# Patient Record
Sex: Male | Born: 1995 | Race: White | Hispanic: No | Marital: Single | Smoking: Never smoker
Health system: Southern US, Community
[De-identification: ages and names within clinical notes are randomized; demographics above are authoritative.]

## PROBLEM LIST (undated history)

## (undated) DIAGNOSIS — R569 Unspecified convulsions: Secondary | ICD-10-CM

## (undated) HISTORY — PX: CIRCUMCISION: SHX1350

## (undated) HISTORY — DX: Unspecified convulsions: R56.9

---
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
## 2017-02-23 ENCOUNTER — Other Ambulatory Visit: Payer: Self-pay

## 2017-02-23 MED ORDER — CITALOPRAM HYDROBROMIDE 20 MG PO TABS: 40.0000 mg | ORAL_TABLET | Freq: Every day | ORAL | 3 refills | Status: DC

## 2017-03-28 ENCOUNTER — Ambulatory Visit: Admitting: Family Medicine

## 2017-09-09 ENCOUNTER — Ambulatory Visit (INDEPENDENT_AMBULATORY_CARE_PROVIDER_SITE_OTHER): Admitting: Family Medicine

## 2017-09-09 ENCOUNTER — Encounter: Payer: Self-pay | Admitting: Family Medicine

## 2017-09-09 VITALS — BP 116/68 | HR 87 | Temp 97.9°F | Ht 69.0 in | Wt 153.0 lb

## 2017-09-09 DIAGNOSIS — S91112A Laceration without foreign body of left great toe without damage to nail, initial encounter: Secondary | ICD-10-CM | POA: Diagnosis not present

## 2017-09-09 DIAGNOSIS — F321 Major depressive disorder, single episode, moderate: Secondary | ICD-10-CM | POA: Diagnosis not present

## 2017-09-09 DIAGNOSIS — F419 Anxiety disorder, unspecified: Secondary | ICD-10-CM | POA: Diagnosis not present

## 2017-09-09 MED ORDER — SERTRALINE HCL 100 MG PO TABS: 100.0000 mg | ORAL_TABLET | Freq: Every day | ORAL | 3 refills | Status: DC

## 2017-09-09 NOTE — Patient Instructions (Signed)
It was very nice to see you today.  Your toe looks like it is healing normally.  We do not need to do anything else at this point.  Please keep a close eye on it and let me know if you see any redness or swelling or drainage.  We will switch your Celexa to Zoloft today.  Please take 1 pill daily.  Come back to see me in 4 to 6 weeks.  I think would also be a good idea for you to see our behavioral specialist.  Please let me know if you like to have this appointment set up.  Take care, Dr Jimmey Ralph

## 2017-09-09 NOTE — Progress Notes (Signed)
Subjective:  Jacob Huffman is a 22 y.o. male who presents today with a chief complaint of toe laceration.   HPI:  Toe Laceration, New problem Patient unfortunately suffered a laceration to his left great toe 4 days ago while mowing his yard.  Patient states that he was going downhill when he lost his balance and slipped and his foot went under the lawnmower.  He went to urgent care for evaluation.  Reportedly had x-rays done which were negative.  His laceration was treated by tissue adhesive and he was started on Keflex.  His wound has been stable over the last couple of days.  No pain.  No drainage.  No redness.  Depression/Anxiety, established problems, Stable Patient initially seen for this 9 months ago.  At that time he was started on Celexa.  He did well on this for about 6 or 7 months, however has noticed worsened symptoms over the last couple of months.  He is not sure if this is due to the medication wearing off or if it is due to increased stress at school.  Patient also notes that during his most recent semester he had several projects that he is not particularly interested in.  He ended up doing well this semester.  He hopes the next semester will be different.  Patient also states that the Celexa never made him feel fully better.  He is possibly interested in switching medications today.  Depression screen PHQ 2/9 09/09/2017  Decreased Interest 3  Down, Depressed, Hopeless 3  PHQ - 2 Score 6  Altered sleeping 2  Tired, decreased energy 3  Change in appetite 1  Feeling bad or failure about yourself  2  Trouble concentrating 1  Moving slowly or fidgety/restless 0  Suicidal thoughts 0  PHQ-9 Score 15  Difficult doing work/chores Very difficult    GAD 7 : Generalized Anxiety Score 09/09/2017  Nervous, Anxious, on Edge 3  Control/stop worrying 3  Worry too much - different things 3  Trouble relaxing 2  Restless 0  Easily annoyed or irritable 0  Afraid - awful might happen  1  Total GAD 7 Score 12  Anxiety Difficulty -   ROS: Per HPI  PMH: He reports that he has never smoked. He has never used smokeless tobacco. He reports that he does not drink alcohol or use drugs.   Objective:  Physical Exam: BP 116/68 (BP Location: Left Arm, Patient Position: Sitting, Cuff Size: Normal)   Pulse 87   Temp 97.9 F (36.6 C) (Oral)   Ht 5\' 9"  (1.753 m)   Wt 153 lb (69.4 kg)   SpO2 97%   BMI 22.59 kg/m   Gen: NAD, resting comfortably MSK: -Left foot: Approximately 2 cm laceration to distal aspect of left great toe.  No surrounding erythema or drainage.  Tissue adhesive in place. Psych: Normal affect.  Normal thought content.  Assessment/Plan:  Anxiety GAD elevated.  Will switch from Celexa to Zoloft as he never had complete remission with Celexa.  Start Zoloft at 100 mg daily.  He will follow-up with me in 4 to 6 weeks.  Increased dose of Zoloft as tolerated.  Discussed possible side effects of medications and reasons to return to care earlier.  Depression, major, single episode, moderate (HCC) See anxiety A/P.  PHQ elevated today switch from Celexa to Zoloft.  Discussed referral to behavioral specialist.  Patient said he will look into this.  Follow-up in 4 to 6 weeks.  Toe laceration Appears  to be healing normally.  He is up-to-date on tetanus vaccines.Do not need further intervention at this point.  Discussed reasons to return to care.  Katina Degree. Jimmey Ralph, MD 09/09/2017 2:43 PM

## 2017-09-09 NOTE — Assessment & Plan Note (Signed)
GAD elevated.  Will switch from Celexa to Zoloft as he never had complete remission with Celexa.  Start Zoloft at 100 mg daily.  He will follow-up with me in 4 to 6 weeks.  Increased dose of Zoloft as tolerated.  Discussed possible side effects of medications and reasons to return to care earlier.

## 2017-09-09 NOTE — Assessment & Plan Note (Signed)
See anxiety A/P.  PHQ elevated today switch from Celexa to Zoloft.  Discussed referral to behavioral specialist.  Patient said he will look into this.  Follow-up in 4 to 6 weeks.

## 2017-09-22 ENCOUNTER — Other Ambulatory Visit: Payer: Self-pay

## 2017-09-22 ENCOUNTER — Telehealth: Payer: Self-pay | Admitting: Family Medicine

## 2017-09-22 MED ORDER — SERTRALINE HCL 100 MG PO TABS: 100.0000 mg | ORAL_TABLET | Freq: Every day | ORAL | 0 refills | Status: DC

## 2017-09-22 NOTE — Telephone Encounter (Signed)
Rx sent to pharmacy   

## 2017-09-22 NOTE — Telephone Encounter (Signed)
Copied from CRM [phone]. Topic: Quick Communication - Rx Refill/Question >> Sep 22, 2017 10:29 AM Oneal GroutSebastian, Jennifer S wrote: Medication: sertraline (ZOLOFT) 100 MG tablet 90 day supply  Has the patient contacted their pharmacy? Yes.  Need rx request (Agent: If no, request that the patient contact the pharmacy for the refill.) (Agent: If yes, when and what did the pharmacy advise?)  [facility]  Agent: Please be advised that RX refills may take up to 3 business days. We ask that you follow-up with your pharmacy.

## 2017-09-22 NOTE — Telephone Encounter (Signed)
LOV  09/09/17 Dr. Jimmey RalphParker Last refill 09/09/17   Pt. Is [facility]

## 2017-09-22 NOTE — Telephone Encounter (Signed)
See request °

## 2018-03-27 ENCOUNTER — Ambulatory Visit: Admitting: Family Medicine

## 2018-03-28 ENCOUNTER — Ambulatory Visit: Admitting: Family Medicine

## 2018-03-28 ENCOUNTER — Ambulatory Visit (INDEPENDENT_AMBULATORY_CARE_PROVIDER_SITE_OTHER): Admitting: Family Medicine

## 2018-03-28 ENCOUNTER — Encounter: Payer: Self-pay | Admitting: Family Medicine

## 2018-03-28 VITALS — BP 104/62 | HR 70 | Temp 98.2°F | Ht 69.0 in | Wt 163.4 lb

## 2018-03-28 DIAGNOSIS — F321 Major depressive disorder, single episode, moderate: Secondary | ICD-10-CM

## 2018-03-28 DIAGNOSIS — F419 Anxiety disorder, unspecified: Secondary | ICD-10-CM | POA: Diagnosis not present

## 2018-03-28 MED ORDER — SERTRALINE HCL 50 MG PO TABS: 50.0000 mg | ORAL_TABLET | Freq: Two times a day (BID) | ORAL | 1 refills | Status: DC

## 2018-03-28 NOTE — Assessment & Plan Note (Signed)
PHQ elevated 17 today.  Discussed treatment options with patient including increasing dose of Zoloft, switching to an alternative SSRI, or augmenting with an additional agent.  He wishes to try twice daily dosing at this time.  Given that his symptoms have improved I think this is reasonable.  We will switch to 50 mg twice daily.  Would ideally like to increase to total 200 mg daily as tolerated before switching to any other agent or augmenting with any other medication.  He will follow-up with me via my chart in a few weeks.

## 2018-03-28 NOTE — Assessment & Plan Note (Signed)
See depression A/P.  Switch Zoloft from 100 mg daily to 50 mg twice daily.

## 2018-03-28 NOTE — Progress Notes (Signed)
   Subjective:  Jacob Huffman is a 22 y.o. male who presents today with a chief complaint of depression and anxiety follow-up.   HPI:  Depression/Anxiety, established problems, Stable Patient was last seen about 7 months ago for this.  At that visit, we switched him from Celexa to Zoloft.  He is currently on 100 mg daily.  Medication helps with his symptoms some however still has feelings of being down, depressed, hopeless, and anxious feelings.  He feels like the medications help him with his problem solving ability however.  Occasionally feels very groggy during the day which he attributes to taking the medication in the morning.  Depression screen PHQ 2/9 03/28/2018  Decreased Interest 2  Down, Depressed, Hopeless 3  PHQ - 2 Score 5  Altered sleeping 3  Tired, decreased energy 3  Change in appetite 1  Feeling bad or failure about yourself  3  Trouble concentrating 2  Moving slowly or fidgety/restless 0  Suicidal thoughts 0  PHQ-9 Score 17  Difficult doing work/chores Somewhat difficult   GAD 7 : Generalized Anxiety Score 03/28/2018  Nervous, Anxious, on Edge 2  Control/stop worrying 3  Worry too much - different things 3  Trouble relaxing 1  Restless 0  Easily annoyed or irritable 1  Afraid - awful might happen 1  Total GAD 7 Score 11  Anxiety Difficulty Somewhat difficult   ROS: Per HPI  Objective:  Physical Exam: BP 104/62 (BP Location: Left Arm, Patient Position: Sitting, Cuff Size: Normal)   Pulse 70   Temp 98.2 F (36.8 C) (Oral)   Ht 5\' 9"  (1.753 m)   Wt 163 lb 6.1 oz (74.1 kg)   SpO2 98%   BMI 24.13 kg/m   Gen: NAD, resting comfortably Neuro: Grossly normal, moves all extremities Psych: Normal affect and thought content  Assessment/Plan:  Depression, major, single episode, moderate (HCC) PHQ elevated 17 today.  Discussed treatment options with patient including increasing dose of Zoloft, switching to an alternative SSRI, or augmenting with an  additional agent.  He wishes to try twice daily dosing at this time.  Given that his symptoms have improved I think this is reasonable.  We will switch to 50 mg twice daily.  Would ideally like to increase to total 200 mg daily as tolerated before switching to any other agent or augmenting with any other medication.  He will follow-up with me via my chart in a few weeks.  Anxiety See depression A/P.  Switch Zoloft from 100 mg daily to 50 mg twice daily.  Jacob Degreealeb M. Jimmey RalphParker, MD 03/28/2018 12:27 PM

## 2018-03-28 NOTE — Patient Instructions (Signed)
It was very nice to see you today!  Please take the zoloft 50mg  twice daily.  Please send me a message in 2 to 3 months via my chart.  Let me know sooner if the medication is not working for you.   Take care, Dr Jimmey RalphParker

## 2018-04-07 ENCOUNTER — Other Ambulatory Visit: Payer: Self-pay | Admitting: Family Medicine

## 2018-04-07 MED ORDER — SERTRALINE HCL 50 MG PO TABS: 50.0000 mg | ORAL_TABLET | Freq: Two times a day (BID) | ORAL | 1 refills | Status: DC

## 2018-04-07 NOTE — Telephone Encounter (Signed)
Copied from CRM [phone]#[id number]. Topic: Quick Communication - Rx Refill/Question >> Apr 07, 2018 11:19 AM Jilda Rocheemaray, Melissa wrote: Medication: sertraline (ZOLOFT) 50 MG tablet  Has the patient contacted their pharmacy? Yes.   (Agent: If no, request that the patient contact the pharmacy for the refill.) (Agent: If yes, when and what did the pharmacy advise?) Contact office  Preferred Pharmacy (with phone number or street name): [facility] [place] [address] [place] [id number] Phone: [phone] Fax: [phone] Not a 24 hour pharmacy; exact hours not known.    Agent: Please be advised that RX refills may take up to 3 business days. We ask that you follow-up with your pharmacy.

## 2018-04-18 ENCOUNTER — Ambulatory Visit: Payer: Self-pay | Admitting: *Deleted

## 2018-04-18 NOTE — Telephone Encounter (Signed)
Patients mother, Darl Pikes, requesting a call back to discuss meningitis and HPV vaccination schedules- and discuss whether they are needed.  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 [place] was not able to find information re: time frame in between series of vaccines. Pt's mother is on [place].  Please advise: [phone]  Reason for Disposition . [1] Caller requesting NON-URGENT health information AND [2] PCP's office is the best resource  Answer Assessment - Initial Assessment Questions 1. REASON FOR CALL or QUESTION: "What is your reason for calling today?" or "How can I best help you?" or "What question do you have that I can help answer?"     Information on vaccines  Protocols used: INFORMATION ONLY CALL-A-AH

## 2018-04-18 NOTE — Telephone Encounter (Signed)
See note

## 2018-04-20 ENCOUNTER — Encounter: Payer: Self-pay | Admitting: Family Medicine

## 2018-04-20 ENCOUNTER — Ambulatory Visit (INDEPENDENT_AMBULATORY_CARE_PROVIDER_SITE_OTHER)

## 2018-04-20 DIAGNOSIS — Z23 Encounter for immunization: Secondary | ICD-10-CM | POA: Diagnosis not present

## 2018-04-20 NOTE — Telephone Encounter (Signed)
Spoke with patient mother regarding Vaccines.Voices understanding.

## 2019-03-26 ENCOUNTER — Other Ambulatory Visit: Payer: Self-pay

## 2019-03-26 ENCOUNTER — Encounter: Payer: Self-pay | Admitting: Family Medicine

## 2019-03-26 ENCOUNTER — Ambulatory Visit (INDEPENDENT_AMBULATORY_CARE_PROVIDER_SITE_OTHER): Admitting: Family Medicine

## 2019-03-26 VITALS — BP 110/68 | HR 68 | Temp 98.2°F | Ht 69.0 in | Wt 166.0 lb

## 2019-03-26 DIAGNOSIS — Z0001 Encounter for general adult medical examination with abnormal findings: Secondary | ICD-10-CM | POA: Diagnosis not present

## 2019-03-26 DIAGNOSIS — F321 Major depressive disorder, single episode, moderate: Secondary | ICD-10-CM

## 2019-03-26 DIAGNOSIS — Z23 Encounter for immunization: Secondary | ICD-10-CM | POA: Diagnosis not present

## 2019-03-26 DIAGNOSIS — F419 Anxiety disorder, unspecified: Secondary | ICD-10-CM | POA: Diagnosis not present

## 2019-03-26 NOTE — Progress Notes (Signed)
Chief Complaint:  Jacob Huffman is a 23 y.o. male who presents today for his annual comprehensive physical exam.    Assessment/Plan:  Depression, major, single episode, moderate (HCC) Resolved.  Now doing well off medications.  Continue with watchful waiting.  Anxiety Resolved.  Doing well off medications.  We will continue with watchful waiting.  Preventative Healthcare: Flu vaccine today.   Patient Counseling(The following topics were reviewed and/or handout was given):  -Nutrition: Stressed importance of moderation in sodium/caffeine intake, saturated fat and cholesterol, caloric balance, sufficient intake of fresh fruits, vegetables, and fiber.  -Stressed the importance of regular exercise.   -Substance Abuse: Discussed cessation/primary prevention of tobacco, alcohol, or other drug use; driving or other dangerous activities under the influence; availability of treatment for abuse.   -Injury prevention: Discussed safety belts, safety helmets, smoke detector, smoking near bedding or upholstery.   -Sexuality: Discussed sexually transmitted diseases, partner selection, use of condoms, avoidance of unintended pregnancy and contraceptive alternatives.   -Dental health: Discussed importance of regular tooth brushing, flossing, and dental visits.  -Health maintenance and immunizations reviewed. Please refer to Health maintenance section.  Return to care in 1 year for next preventative visit.     Subjective:  HPI:  He has no acute complaints today.   Lifestyle Diet: Balanced.  Exercise: No specific exercises.   Depression screen PHQ 2/9 03/26/2019  Decreased Interest 1  Down, Depressed, Hopeless 1  PHQ - 2 Score 2  Altered sleeping 1  Tired, decreased energy 1  Change in appetite 1  Feeling bad or failure about yourself  1  Trouble concentrating 2  Moving slowly or fidgety/restless 0  Suicidal thoughts 0  PHQ-9 Score 8  Difficult doing work/chores Somewhat difficult     Health Maintenance Due  Topic Date Due  . INFLUENZA VACCINE  11/11/2018     ROS: Per HPI, otherwise a complete review of systems was negative.   PMH:  The following were reviewed and entered/updated in epic: Past Medical History:  Diagnosis Date  . Seizures [facility])    Patient Active Problem List   Diagnosis Date Noted  . Depression, major, single episode, moderate (HCC) 09/09/2017  . Anxiety 12/08/2016  . 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 History of seizures 03/29/2016   Past Surgical History:  Procedure Laterality Date  . CIRCUMCISION      Family History  Problem Relation Age of Onset  . Hypothyroidism Mother   . Cancer Maternal Grandmother   . Heart attack Maternal Grandmother   . Heart attack Maternal Grandfather   . Cancer Paternal Grandfather     Medications- reviewed and updated No current outpatient medications on file.   No current facility-administered medications for this visit.    Allergies-reviewed and updated Allergies  Allergen Reactions  . Penicillins Other (See Comments)    **Mother is allergic**    Social History   Socioeconomic History  . Marital status: Single    Spouse name: Not on file  . Number of children: Not on file  . Years of education: Not on file  . Highest education level: Not on file  Occupational History  . Occupation: Consulting civil engineer  Tobacco Use  . Smoking status: Never Smoker  . Smokeless tobacco: Never Used  Substance and Sexual Activity  . Alcohol use: No  . Drug use: No  . Sexual activity: Never  Other Topics Concern  . Not on file  Social History Narrative   Jacob Huffman is  a high school graduate.   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   He attends Secretary/administrator for [employer] in [place].   He lives on campus during school and at home when he's off.   Social Determinants of Health   Financial Resource Strain:   . Difficulty of Paying Living Expenses: Not on file   Food Insecurity:   . Worried About Charity fundraiser in the Last Year: Not on file  . Ran Out of Food in the Last Year: Not on file  Transportation Needs:   . Lack of Transportation (Medical): Not on file  . Lack of Transportation (Non-Medical): Not on file  Physical Activity:   . Days of Exercise per Week: Not on file  . Minutes of Exercise per Session: Not on file  Stress:   . Feeling of Stress : Not on file  Social Connections:   . Frequency of Communication with Friends and Family: Not on file  . Frequency of Social Gatherings with Friends and Family: Not on file  . Attends Religious Services: Not on file  . Active Member of Clubs or Organizations: Not on file  . Attends Archivist Meetings: Not on file  . Marital Status: Not on file        Objective:  Physical Exam: BP 110/68   Pulse 68   Temp 98.2 F (36.8 C)   Ht 5\' 9"  (1.753 m)   Wt 166 lb (75.3 kg)   SpO2 99%   BMI 24.51 kg/m   Body mass index is 24.51 kg/m. Wt Readings from Last 3 Encounters:  03/26/19 166 lb (75.3 kg)  03/28/18 163 lb 6.1 oz (74.1 kg)  09/09/17 153 lb (69.4 kg) (46 %, Z= -0.11)*   * Growth percentiles are based on CDC (Boys, 2-20 Years) data.   Gen: NAD, resting comfortably HEENT: TMs normal bilaterally. OP clear. No thyromegaly noted.  CV: RRR with no murmurs appreciated Pulm: NWOB, CTAB with no crackles, wheezes, or rhonchi GI: Normal bowel sounds present. Soft, Nontender, Nondistended. MSK: no edema, cyanosis, or clubbing noted Skin: warm, dry Neuro: CN2-12 grossly intact. Strength 5/5 in upper and lower extremities. Reflexes symmetric and intact bilaterally.  Psych: Normal affect and thought content     Caleb M. Jerline Pain, MD 03/26/2019 3:32 PM

## 2019-03-26 NOTE — Patient Instructions (Signed)
It was very nice to see you today!  Keep up the good work!  We will give your flu vaccine today.  Come back in 1 year for your next checkup, or sooner if needed.  Take care, Dr Jerline Pain  Please try these tips to maintain a healthy lifestyle:   Eat at least 3 REAL meals and 1-2 snacks per day.  Aim for no more than 5 hours between eating.  If you eat breakfast, please do so within one hour of getting up.    Each meal should contain half fruits/vegetables, one quarter protein, and one quarter carbs (no bigger than a computer mouse)   Cut down on sweet beverages. This includes juice, soda, and sweet tea.     Drink at least 1 glass of water with each meal and aim for at least 8 glasses per day   Exercise at least 150 minutes every week.   Preventive Care 48-43 Years Old, Male Preventive care refers to lifestyle choices and visits with your health care provider that can promote health and wellness. This includes:  A yearly physical exam. This is also called an annual well check.  Regular dental and eye exams.  Immunizations.  Screening for certain conditions.  Healthy lifestyle choices, such as eating a healthy diet, getting regular exercise, not using drugs or products that contain nicotine and tobacco, and limiting alcohol use. What can I expect for my preventive care visit? Physical exam Your health care provider will check:  Height and weight. These may be used to calculate body mass index (BMI), which is a measurement that tells if you are at a healthy weight.  Heart rate and blood pressure.  Your skin for abnormal spots. Counseling Your health care provider may ask you questions about:  Alcohol, tobacco, and drug use.  Emotional well-being.  Home and relationship well-being.  Sexual activity.  Eating habits.  Work and work Statistician. What immunizations do I need?  Influenza (flu) vaccine  This is recommended every year. Tetanus, diphtheria, and  pertussis (Tdap) vaccine  You may need a Td booster every 10 years. Varicella (chickenpox) vaccine  You may need this vaccine if you have not already been vaccinated. Human papillomavirus (HPV) vaccine  If recommended by your health care provider, you may need three doses over 6 months. Measles, mumps, and rubella (MMR) vaccine  You may need at least one dose of MMR. You may also need a second dose. Meningococcal conjugate (MenACWY) vaccine  One dose is recommended if you are 58-9 years old and a Market researcher living in a residence hall, or if you have one of several medical conditions. You may also need additional booster doses. Pneumococcal conjugate (PCV13) vaccine  You may need this if you have certain conditions and were not previously vaccinated. Pneumococcal polysaccharide (PPSV23) vaccine  You may need one or two doses if you smoke cigarettes or if you have certain conditions. Hepatitis A vaccine  You may need this if you have certain conditions or if you travel or work in places where you may be exposed to hepatitis A. Hepatitis B vaccine  You may need this if you have certain conditions or if you travel or work in places where you may be exposed to hepatitis B. Haemophilus influenzae type b (Hib) vaccine  You may need this if you have certain risk factors. You may receive vaccines as individual doses or as more than one vaccine together in one shot (combination vaccines). Talk with your health  care provider about the risks and benefits of combination vaccines. What tests do I need? Blood tests  Lipid and cholesterol levels. These may be checked every 5 years starting at age 41.  Hepatitis C test.  Hepatitis B test. Screening   Diabetes screening. This is done by checking your blood sugar (glucose) after you have not eaten for a while (fasting).  Sexually transmitted disease (STD) testing. Talk with your health care provider about your test results,  treatment options, and if necessary, the need for more tests. Follow these instructions at home: Eating and drinking   Eat a diet that includes fresh fruits and vegetables, whole grains, lean protein, and low-fat dairy products.  Take vitamin and mineral supplements as recommended by your health care provider.  Do not drink alcohol if your health care provider tells you not to drink.  If you drink alcohol: ? Limit how much you have to 0-2 drinks a day. ? Be aware of how much alcohol is in your drink. In the [place] one drink equals one 12 oz bottle of beer (355 mL), one 5 oz glass of wine (148 mL), or one 1 oz glass of hard liquor (44 mL). Lifestyle  Take daily care of your teeth and gums.  Stay active. Exercise for at least 30 minutes on 5 or more days each week.  Do not use any products that contain nicotine or tobacco, such as cigarettes, e-cigarettes, and chewing tobacco. If you need help quitting, ask your health care provider.  If you are sexually active, practice safe sex. Use a condom or other form of protection to prevent STIs (sexually transmitted infections). What's next?  Go to your health care provider once a year for a well check visit.  Ask your health care provider how often you should have your eyes and teeth checked.  Stay up to date on all vaccines. This information is not intended to replace advice given to you by your health care provider. Make sure you discuss any questions you have with your health care provider. Document Released: 05/25/2001 Document Revised: 03/23/2018 Document Reviewed: 03/23/2018 Elsevier Patient Education  2020 [employer].

## 2019-03-26 NOTE — Assessment & Plan Note (Signed)
Resolved.  Now doing well off medications.  Continue with watchful waiting.

## 2019-03-26 NOTE — Assessment & Plan Note (Signed)
Resolved.  Doing well off medications.  We will continue with watchful waiting.

## 2019-08-01 ENCOUNTER — Telehealth: Payer: Self-pay | Admitting: Family Medicine

## 2019-08-01 NOTE — Telephone Encounter (Signed)
Patient states he had COVID in January.  Got the 1st pfizer COVID vac almost 3 weeks ago.  Patient experienced flu like symptoms with the first vac.  Would like to know if he needs to get the 2nd vac?

## 2019-08-01 NOTE — Telephone Encounter (Signed)
Please advise 

## 2019-08-02 NOTE — Telephone Encounter (Signed)
Left detailed message regarding information.Left voice message for patient to call clinic if needed.

## 2019-08-02 NOTE — Telephone Encounter (Signed)
YEs ok for him to get second vaccine. It is ok for him to take tylenol if needed if symptoms happen again.  Katina Degree. Jimmey Ralph, MD 08/02/2019 9:47 AM

## 2023-10-21 ENCOUNTER — Ambulatory Visit (INDEPENDENT_AMBULATORY_CARE_PROVIDER_SITE_OTHER): Admitting: Physician Assistant

## 2023-10-21 ENCOUNTER — Encounter: Payer: Self-pay | Admitting: Family

## 2023-10-21 ENCOUNTER — Other Ambulatory Visit: Payer: Self-pay

## 2023-10-21 DIAGNOSIS — M79672 Pain in left foot: Secondary | ICD-10-CM | POA: Diagnosis not present

## 2023-10-21 NOTE — Progress Notes (Signed)
 Office Visit Note   Patient: Jacob Huffman           Date of Birth: [birth year]           MRN: [id number] Visit Date: 10/21/2023              Requested by: No referring provider defined for this encounter. PCP: No primary care provider on file.  No chief complaint on file.     HPI: 28 y/o male who reports after hiking 10 + miles he developed medial foot pain.  The pain was worst in the am when he first got out of bed.  It is improving.  He denies trauma, erythema or edema.  He has plans to hick in [place] in 2-3 weeks about 15 mile a day and wanted to have his foot examined.     Assessment & Plan: Visit Diagnoses:  1. Pain in left foot     Plan: RICE, rest ice prn, compression and elevation.  Hiking as tolerates.  Proper shoe wear to support the foot and ankle.   Follow-Up Instructions: Return if symptoms worsen or fail to improve.   Ortho Exam  Patient is alert, oriented, no adenopathy, well-dressed, normal affect, normal respiratory effort. No edema or erythema in the right foot.  He has easily palpable pedal pulses B LE.  No plantar callus formation and no ischemic changes.  NTTP ankle, mid foot or MTP joints.  Slight tenderness with weight bearing talonavicular area left foot.      Imaging: Left foot x ray without fractures, liz frank intact, no evidence of arthritis.  Talonavicular joint space maintained.  Labs: No results found for: HGBA1C, ESRSEDRATE, CRP, LABURIC, REPTSTATUS, GRAMSTAIN, CULT, LABORGA   No results found for: ALBUMIN, PREALBUMIN, CBC  No results found for: MG No results found for: VD25OH  No results found for: PREALBUMIN    Latest Ref Rng & Units 12/08/2016   11:02 AM  CBC EXTENDED  WBC 4.5 - 13.5 K/uL 4.5   RBC 3.80 - 5.70 Mil/uL 4.88   Hemoglobin 12.0 - 16.0 g/dL 84.8   HCT 63.9 - 50.9 % 45.0   Platelets 150.0 - 575.0 K/uL 188.0   NEUT# 1.4 - 7.7 K/uL 2.5   Lymph# 0.7 - 4.0 K/uL 1.6      There is no  height or weight on file to calculate BMI.  Orders:  Orders Placed This Encounter  Procedures   XR Foot Complete Left   No orders of the defined types were placed in this encounter.    Procedures: No procedures performed  Clinical Data: No additional findings.  ROS:  All other systems negative, except as noted in the HPI. Review of Systems  Objective: Vital Signs: There were no vitals taken for this visit.  Specialty Comments:  No specialty comments available.  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   History of seizures 03/29/2016   Past Medical History:  Diagnosis Date   Seizures (HCC)     Family History  Problem Relation Age of Onset   Hypothyroidism Mother    Cancer Maternal Grandmother    Heart attack Maternal Grandmother    Heart attack Maternal Grandfather    Cancer Paternal Grandfather     Past Surgical History:  Procedure Laterality Date   CIRCUMCISION  Social History   Occupational History   Occupation: Consulting civil engineer  Tobacco Use   Smoking status: Never   Smokeless tobacco: Never  Vaping Use   Vaping status: Never Used  Substance and Sexual Activity   Alcohol use: No   Drug use: No   Sexual activity: Never

## 2023-10-31 ENCOUNTER — Encounter: Payer: Self-pay | Admitting: Physician Assistant

## 2024-02-13 ENCOUNTER — Encounter: Payer: Self-pay | Admitting: Radiology
